# Patient Record
Sex: Female | Born: 1970 | Hispanic: Yes | Marital: Single | Smoking: Never smoker
Health system: Southern US, Community
[De-identification: ages and names within clinical notes are randomized; demographics above are authoritative.]

## PROBLEM LIST (undated history)

## (undated) DIAGNOSIS — M1990 Unspecified osteoarthritis, unspecified site: Secondary | ICD-10-CM

## (undated) HISTORY — DX: Unspecified osteoarthritis, unspecified site: M19.90

## (undated) HISTORY — PX: TENDON REPAIR: SHX5111

---

## 2007-12-18 ENCOUNTER — Emergency Department: Payer: Self-pay | Admitting: Emergency Medicine

## 2010-06-30 ENCOUNTER — Emergency Department: Payer: Self-pay | Admitting: Internal Medicine

## 2017-02-12 ENCOUNTER — Other Ambulatory Visit: Payer: Self-pay | Admitting: Family Medicine

## 2017-02-12 DIAGNOSIS — Z1231 Encounter for screening mammogram for malignant neoplasm of breast: Secondary | ICD-10-CM

## 2017-02-23 ENCOUNTER — Encounter: Payer: Self-pay | Admitting: Radiology

## 2017-02-23 ENCOUNTER — Ambulatory Visit
Admission: RE | Admit: 2017-02-23 | Discharge: 2017-02-23 | Disposition: A | Discharge status: Home / Self Care | Payer: BLUE CROSS/BLUE SHIELD | Source: Ambulatory Visit | Attending: Family Medicine | Admitting: Family Medicine

## 2017-02-23 DIAGNOSIS — Z1231 Encounter for screening mammogram for malignant neoplasm of breast: Secondary | ICD-10-CM | POA: Insufficient documentation

## 2018-06-27 ENCOUNTER — Emergency Department (HOSPITAL_COMMUNITY): Payer: No Typology Code available for payment source

## 2018-06-27 ENCOUNTER — Inpatient Hospital Stay (HOSPITAL_COMMUNITY)
Admission: EM | Admit: 2018-06-27 | Discharge: 2018-06-29 | DRG: 605 | Disposition: A | Discharge status: Home / Self Care | Payer: No Typology Code available for payment source | Attending: Surgery | Admitting: Surgery

## 2018-06-27 ENCOUNTER — Other Ambulatory Visit: Payer: Self-pay

## 2018-06-27 ENCOUNTER — Encounter (HOSPITAL_COMMUNITY): Payer: Self-pay | Admitting: Emergency Medicine

## 2018-06-27 DIAGNOSIS — S61215A Laceration without foreign body of left ring finger without damage to nail, initial encounter: Secondary | ICD-10-CM | POA: Diagnosis present

## 2018-06-27 DIAGNOSIS — S27892A Contusion of other specified intrathoracic organs, initial encounter: Secondary | ICD-10-CM | POA: Diagnosis present

## 2018-06-27 DIAGNOSIS — S272XXA Traumatic hemopneumothorax, initial encounter: Secondary | ICD-10-CM | POA: Diagnosis present

## 2018-06-27 DIAGNOSIS — T7611XA Adult physical abuse, suspected, initial encounter: Secondary | ICD-10-CM | POA: Diagnosis present

## 2018-06-27 DIAGNOSIS — Z23 Encounter for immunization: Secondary | ICD-10-CM

## 2018-06-27 DIAGNOSIS — S61213A Laceration without foreign body of left middle finger without damage to nail, initial encounter: Secondary | ICD-10-CM | POA: Diagnosis present

## 2018-06-27 DIAGNOSIS — S21112A Laceration without foreign body of left front wall of thorax without penetration into thoracic cavity, initial encounter: Principal | ICD-10-CM | POA: Diagnosis present

## 2018-06-27 DIAGNOSIS — S21119A Laceration without foreign body of unspecified front wall of thorax without penetration into thoracic cavity, initial encounter: Secondary | ICD-10-CM | POA: Diagnosis present

## 2018-06-27 LAB — TYPE AND SCREEN
ABO/RH(D): O NEG
ANTIBODY SCREEN: NEGATIVE
UNIT DIVISION: 0
Unit division: 0

## 2018-06-27 LAB — PREPARE FRESH FROZEN PLASMA
UNIT DIVISION: 0
Unit division: 0

## 2018-06-27 LAB — COMPREHENSIVE METABOLIC PANEL
ALBUMIN: 3.9 g/dL (ref 3.5–5.0)
ALK PHOS: 72 U/L (ref 38–126)
ALT: 31 U/L (ref 0–44)
AST: 29 U/L (ref 15–41)
Anion gap: 7 (ref 5–15)
BILIRUBIN TOTAL: 0.5 mg/dL (ref 0.3–1.2)
BUN: 17 mg/dL (ref 6–20)
CO2: 20 mmol/L — ABNORMAL LOW (ref 22–32)
CREATININE: 0.96 mg/dL (ref 0.44–1.00)
Calcium: 8.3 mg/dL — ABNORMAL LOW (ref 8.9–10.3)
Chloride: 107 mmol/L (ref 98–111)
GFR calc Af Amer: 40 mL/min — ABNORMAL LOW (ref >60)
GFR calc non Af Amer: 34 mL/min — ABNORMAL LOW (ref >60)
GLUCOSE: 155 mg/dL — AB (ref 70–99)
Potassium: 3.5 mmol/L (ref 3.5–5.1)
Sodium: 134 mmol/L — ABNORMAL LOW (ref 135–145)
TOTAL PROTEIN: 7.3 g/dL (ref 6.5–8.1)

## 2018-06-27 LAB — BPAM RBC
BLOOD PRODUCT EXPIRATION DATE: 201910202359
Blood Product Expiration Date: 201910202359
ISSUE DATE / TIME: 201910062021
ISSUE DATE / TIME: 201910062021
UNIT TYPE AND RH: 9500
Unit Type and Rh: 9500

## 2018-06-27 LAB — I-STAT CHEM 8, ED
BUN: 23 mg/dL — ABNORMAL HIGH (ref 6–20)
CHLORIDE: 109 mmol/L (ref 98–111)
Calcium, Ion: 1 mmol/L — ABNORMAL LOW (ref 1.15–1.40)
Creatinine, Ser: 0.9 mg/dL (ref 0.44–1.00)
Glucose, Bld: 155 mg/dL — ABNORMAL HIGH (ref 70–99)
HEMATOCRIT: 43 % (ref 36.0–46.0)
Hemoglobin: 14.6 g/dL (ref 12.0–15.0)
POTASSIUM: 4.3 mmol/L (ref 3.5–5.1)
SODIUM: 140 mmol/L (ref 135–145)
TCO2: 22 mmol/L (ref 22–32)

## 2018-06-27 LAB — URINALYSIS, ROUTINE W REFLEX MICROSCOPIC
BACTERIA UA: NONE SEEN
Bilirubin Urine: NEGATIVE
Glucose, UA: NEGATIVE mg/dL
Ketones, ur: NEGATIVE mg/dL
Leukocytes, UA: NEGATIVE
Nitrite: NEGATIVE
Protein, ur: NEGATIVE mg/dL
SPECIFIC GRAVITY, URINE: 1.038 — AB (ref 1.005–1.030)
pH: 6 (ref 5.0–8.0)

## 2018-06-27 LAB — BPAM FFP
BLOOD PRODUCT EXPIRATION DATE: 201910192359
BLOOD PRODUCT EXPIRATION DATE: 201910262359
ISSUE DATE / TIME: 201910062021
ISSUE DATE / TIME: 201910062021
UNIT TYPE AND RH: 600
Unit Type and Rh: 6200

## 2018-06-27 LAB — CDS SEROLOGY

## 2018-06-27 LAB — CBC
HEMATOCRIT: 43.8 % (ref 36.0–46.0)
Hemoglobin: 13.6 g/dL (ref 12.0–15.0)
MCH: 26.6 pg (ref 26.0–34.0)
MCHC: 31.1 g/dL (ref 30.0–36.0)
MCV: 85.7 fL (ref 78.0–100.0)
Platelets: 258 10*3/uL (ref 150–400)
RBC: 5.11 MIL/uL (ref 3.87–5.11)
RDW: 13.9 % (ref 11.5–15.5)
WBC: 8.1 10*3/uL (ref 4.0–10.5)

## 2018-06-27 LAB — I-STAT CG4 LACTIC ACID, ED: LACTIC ACID, VENOUS: 2.26 mmol/L — AB (ref 0.5–1.9)

## 2018-06-27 LAB — PROTIME-INR
INR: 0.99
Prothrombin Time: 13 seconds (ref 11.4–15.2)

## 2018-06-27 LAB — ETHANOL: Alcohol, Ethyl (B): <10 mg/dL (ref <10)

## 2018-06-27 MED ORDER — MORPHINE SULFATE (PF) 2 MG/ML IV SOLN: 1.0000 mg | INTRAVENOUS | Status: DC | PRN

## 2018-06-27 MED ORDER — LIDOCAINE-EPINEPHRINE (PF) 2 %-1:200000 IJ SOLN
10.0000 mL | Freq: Once | INTRAMUSCULAR | Status: AC
  Administered 2018-06-27: 10 mL via INTRADERMAL
  Filled 2018-06-27: qty 20

## 2018-06-27 MED ORDER — OXYCODONE HCL 5 MG PO TABS
5.0000 mg | ORAL_TABLET | ORAL | Status: DC | PRN
  Administered 2018-06-27: 5 mg via ORAL
  Filled 2018-06-27: qty 1

## 2018-06-27 MED ORDER — OXYCODONE HCL 5 MG PO TABS: 2.5000 mg | ORAL_TABLET | ORAL | Status: DC | PRN

## 2018-06-27 MED ORDER — CEFAZOLIN SODIUM-DEXTROSE 1-4 GM/50ML-% IV SOLN
1.0000 g | Freq: Once | INTRAVENOUS | Status: AC
  Administered 2018-06-27: 1 g via INTRAVENOUS
  Filled 2018-06-27: qty 50

## 2018-06-27 MED ORDER — SODIUM CHLORIDE 0.9 % IV SOLN
Freq: Once | INTRAVENOUS | Status: AC
  Administered 2018-06-27: 21:00:00 via INTRAVENOUS

## 2018-06-27 MED ORDER — IOHEXOL 300 MG/ML  SOLN
75.0000 mL | Freq: Once | INTRAMUSCULAR | Status: AC | PRN
  Administered 2018-06-27: 75 mL via INTRAVENOUS

## 2018-06-27 MED ORDER — MORPHINE SULFATE (PF) 2 MG/ML IV SOLN: 2.0000 mg | INTRAVENOUS | Status: DC | PRN

## 2018-06-27 MED ORDER — FENTANYL CITRATE (PF) 100 MCG/2ML IJ SOLN
50.0000 ug | Freq: Once | INTRAMUSCULAR | Status: AC
  Administered 2018-06-27: 50 ug via INTRAVENOUS
  Filled 2018-06-27: qty 2

## 2018-06-27 MED ORDER — MORPHINE SULFATE (PF) 4 MG/ML IV SOLN
4.0000 mg | INTRAVENOUS | Status: DC | PRN
  Administered 2018-06-27 – 2018-06-28 (×3): 4 mg via INTRAVENOUS
  Filled 2018-06-27 (×3): qty 1

## 2018-06-27 MED ORDER — ONDANSETRON 4 MG PO TBDP: 4.0000 mg | ORAL_TABLET | Freq: Four times a day (QID) | ORAL | Status: DC | PRN

## 2018-06-27 MED ORDER — ONDANSETRON HCL 4 MG/2ML IJ SOLN
INTRAMUSCULAR | Status: AC
  Administered 2018-06-27: 4 mg via INTRAVENOUS
  Filled 2018-06-27: qty 2

## 2018-06-27 MED ORDER — TETANUS-DIPHTH-ACELL PERTUSSIS 5-2.5-18.5 LF-MCG/0.5 IM SUSP
0.5000 mL | Freq: Once | INTRAMUSCULAR | Status: AC
  Administered 2018-06-27: 0.5 mL via INTRAMUSCULAR
  Filled 2018-06-27: qty 0.5

## 2018-06-27 MED ORDER — FENTANYL CITRATE (PF) 100 MCG/2ML IJ SOLN
INTRAMUSCULAR | Status: AC
  Administered 2018-06-27: 100 ug
  Filled 2018-06-27: qty 2

## 2018-06-27 MED ORDER — TETANUS-DIPHTH-ACELL PERTUSSIS 5-2.5-18.5 LF-MCG/0.5 IM SUSP
INTRAMUSCULAR | Status: AC
  Filled 2018-06-27: qty 0.5

## 2018-06-27 MED ORDER — OXYCODONE HCL 5 MG PO TABS: 10.0000 mg | ORAL_TABLET | ORAL | Status: DC | PRN

## 2018-06-27 MED ORDER — ONDANSETRON HCL 4 MG/2ML IJ SOLN
4.0000 mg | Freq: Four times a day (QID) | INTRAMUSCULAR | Status: DC | PRN
  Administered 2018-06-27 – 2018-06-28 (×3): 4 mg via INTRAVENOUS
  Filled 2018-06-27: qty 2
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
## 2018-06-27 NOTE — ED Notes (Signed)
Clothing went with [employer] officer [id number].
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
## 2018-06-27 NOTE — ED Notes (Signed)
Dr Jodi Mourning informed of lactic acid results 2.26

## 2018-06-28 ENCOUNTER — Other Ambulatory Visit: Payer: Self-pay

## 2018-06-28 ENCOUNTER — Inpatient Hospital Stay (HOSPITAL_COMMUNITY): Payer: No Typology Code available for payment source

## 2018-06-28 LAB — CBC
HCT: 42 % (ref 36.0–46.0)
HEMOGLOBIN: 13.5 g/dL (ref 12.0–15.0)
MCH: 27.1 pg (ref 26.0–34.0)
MCHC: 32.1 g/dL (ref 30.0–36.0)
MCV: 84.3 fL (ref 78.0–100.0)
Platelets: 221 10*3/uL (ref 150–400)
RBC: 4.98 MIL/uL (ref 3.87–5.11)
RDW: 14 % (ref 11.5–15.5)
WBC: 10.4 10*3/uL (ref 4.0–10.5)

## 2018-06-28 LAB — BASIC METABOLIC PANEL
Anion gap: 8 (ref 5–15)
BUN: 15 mg/dL (ref 6–20)
CALCIUM: 8.1 mg/dL — AB (ref 8.9–10.3)
CHLORIDE: 110 mmol/L (ref 98–111)
CO2: 19 mmol/L — ABNORMAL LOW (ref 22–32)
CREATININE: 0.73 mg/dL (ref 0.44–1.00)
GFR calc Af Amer: >60 mL/min (ref >60)
Glucose, Bld: 112 mg/dL — ABNORMAL HIGH (ref 70–99)
Potassium: 4.8 mmol/L (ref 3.5–5.1)
SODIUM: 137 mmol/L (ref 135–145)

## 2018-06-28 LAB — MRSA PCR SCREENING: MRSA by PCR: NEGATIVE

## 2018-06-28 LAB — ABO/RH: ABO/RH(D): O NEG

## 2018-06-28 LAB — BLOOD PRODUCT ORDER (VERBAL) VERIFICATION

## 2018-06-28 MED ORDER — CEFAZOLIN SODIUM-DEXTROSE 2-4 GM/100ML-% IV SOLN
2.0000 g | Freq: Three times a day (TID) | INTRAVENOUS | Status: DC
  Administered 2018-06-28 – 2018-06-29 (×4): 2 g via INTRAVENOUS
  Filled 2018-06-28 (×5): qty 100

## 2018-06-28 MED ORDER — MORPHINE SULFATE (PF) 4 MG/ML IV SOLN: 4.0000 mg | INTRAVENOUS | Status: DC | PRN

## 2018-06-28 MED ORDER — BACITRACIN-NEOMYCIN-POLYMYXIN 400-5-5000 EX OINT
TOPICAL_OINTMENT | Freq: Every day | CUTANEOUS | Status: DC
  Administered 2018-06-28: 1 via TOPICAL
  Administered 2018-06-29: 09:00:00 via TOPICAL
  Filled 2018-06-28 (×2): qty 1

## 2018-06-28 MED ORDER — IBUPROFEN 200 MG PO TABS
400.0000 mg | ORAL_TABLET | Freq: Four times a day (QID) | ORAL | Status: DC | PRN
  Filled 2018-06-28: qty 2

## 2018-06-28 MED ORDER — HYDROCODONE-ACETAMINOPHEN 7.5-325 MG PO TABS
1.0000 | ORAL_TABLET | Freq: Four times a day (QID) | ORAL | Status: DC | PRN
  Administered 2018-06-28 – 2018-06-29 (×3): 1 via ORAL
  Filled 2018-06-28 (×3): qty 1

## 2018-06-28 MED ORDER — ENOXAPARIN SODIUM 40 MG/0.4ML ~~LOC~~ SOLN
40.0000 mg | SUBCUTANEOUS | Status: DC
  Administered 2018-06-28: 40 mg via SUBCUTANEOUS
  Filled 2018-06-28: qty 0.4

## 2018-06-28 MED ORDER — POTASSIUM CHLORIDE IN NACL 20-0.9 MEQ/L-% IV SOLN
INTRAVENOUS | Status: DC
  Administered 2018-06-28: 03:00:00 via INTRAVENOUS
  Filled 2018-06-28: qty 1000
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
## 2018-06-28 NOTE — Progress Notes (Signed)
Report given to Herbert Seta, RN 4NP

## 2018-06-28 NOTE — Progress Notes (Addendum)
CRITICAL VALUE ALERT  Critical Value: chest xray results called from [facility] Radiology  Date & Time Notied:  06/28/18 at 0730  Provider Notified: Janee Morn, MD   Orders Received/Actions taken: No new orders at this time

## 2018-06-29 MED ORDER — HYDROCODONE-ACETAMINOPHEN 7.5-325 MG PO TABS: 1.0000 | ORAL_TABLET | Freq: Four times a day (QID) | ORAL | 0 refills | Status: AC | PRN

## 2018-06-29 NOTE — Progress Notes (Signed)
  Subjective: Less chest pain, no SOB  Objective: Vital signs in last 24 hours: Temp:  [98.1 F (36.7 C)-98.8 F (37.1 C)] 98.1 F (36.7 C) (10/08 0300) Pulse Rate:  [65-94] 76 (10/08 0751) Resp:  [10-20] 14 (10/08 0751) BP: (113-143)/(71-84) 122/72 (10/08 0751) SpO2:  [96 %-100 %] 98 % (10/08 0751) Last BM Date: (PTA)  Intake/Output from previous day: 10/07 0701 - 10/08 0700 In: 669.9 [P.O.:480; I.V.:189.9] Out: -  Intake/Output this shift: Total I/O In: 400.4 [I.V.:100.4; IV Piggyback:300] Out: -   General appearance: alert and cooperative Resp: clear to auscultation bilaterally Chest wall: SW CDI with staples, small hematoma, no cellulitis Cardio: regular rate and rhythm GI: soft, non-tender; bowel sounds normal; no masses,  no organomegaly Extremities: L fingers 103 SWs CDI with sutures  Lab Results: CBC  Recent Labs    06/27/18 2033 06/27/18 2045 06/28/18 0243  WBC 8.1  --  10.4  HGB 13.6 14.6 13.5  HCT 43.8 43.0 42.0  PLT 258  --  221   BMET Recent Labs    06/27/18 2033 06/27/18 2045 06/28/18 0243  NA 134* 140 137  K 3.5 4.3 4.8  CL 107 109 110  CO2 20*  --  19*  GLUCOSE 155* 155* 112*  BUN 17 23* 15  CREATININE 0.96 0.90 0.73  CALCIUM 8.3*  --  8.1*   PT/INR Recent Labs    06/27/18 2033  LABPROT 13.0  INR 0.99    Anti-infectives: Anti-infectives (From admission, onward)   Start     Dose/Rate Route Frequency Ordered Stop   06/28/18 0400  ceFAZolin (ANCEF) IVPB 2g/100 mL premix     2 g 200 mL/hr over 30 Minutes Intravenous Every 8 hours 06/28/18 0015     06/27/18 2045  ceFAZolin (ANCEF) IVPB 1 g/50 mL premix     1 g 100 mL/hr over 30 Minutes Intravenous  Once 06/27/18 2033 06/27/18 2112      Assessment/Plan: SW chest Mediastinal hematoma and pneumomediastinum - stable ID - completed Ancef FEN - Soft diet VTE - PAS, Lovenox if not D/C Dispo - CSW to speak with her with interpreter at 1100 then plan D/C home after. She lives  with her 24yo son. Wound care instructions discussed with her. Plan F/U in trauma clinic 10/15   LOS: 2 days    Violeta Gelinas, MD, MPH, FACS Trauma: [phone] General Surgery: [phone]  10/8/2019Patient ID: Anna Vega, female   DOB: [birth year], 47 y.o.   MRN: [id number]

## 2018-06-29 NOTE — Progress Notes (Signed)
Pt with d/c orders. Discharge summary reviewed with pt and son at bedside. All questions answered. Medication already sent to pharmacy to be picked up by pt. Ivs removed. Pt escorted to car via wheelchair.
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
## 2018-06-30 ENCOUNTER — Encounter: Payer: Self-pay | Admitting: Radiology

## 2019-12-16 ENCOUNTER — Ambulatory Visit: Payer: Self-pay | Attending: Internal Medicine

## 2020-01-06 ENCOUNTER — Ambulatory Visit: Payer: Self-pay

## 2021-04-17 ENCOUNTER — Encounter: Payer: Self-pay | Admitting: *Deleted

## 2021-04-17 ENCOUNTER — Ambulatory Visit: Payer: Self-pay | Attending: Oncology | Admitting: *Deleted

## 2021-04-17 ENCOUNTER — Encounter: Payer: Self-pay | Admitting: Student

## 2021-04-17 ENCOUNTER — Other Ambulatory Visit: Payer: Self-pay

## 2021-04-17 ENCOUNTER — Ambulatory Visit
Admission: RE | Admit: 2021-04-17 | Discharge: 2021-04-17 | Disposition: A | Discharge status: Home / Self Care | Payer: Self-pay | Source: Ambulatory Visit | Attending: Oncology | Admitting: Oncology

## 2021-04-17 VITALS — BP 141/86 | HR 91 | Temp 96.7°F | Ht 61.0 in | Wt 177.7 lb

## 2021-04-17 DIAGNOSIS — Z0000 Encounter for general adult medical examination without abnormal findings: Secondary | ICD-10-CM

## 2021-04-17 NOTE — Patient Instructions (Signed)
Gave patient hand-out, Women Staying Healthy, Active and Well from [facility], with education on breast health, pap smears, heart and colon health. 

## 2021-04-17 NOTE — Progress Notes (Signed)
  Subjective:     Patient ID: Anna Vega, female   DOB: Jul 28, 1971, 50 y.o.   MRN: [id number]  HPI  B[facility]CP Medical History Record - 04/17/21 1017       Breast History   Screening cycle New    Provider ([facility]) [facility]    Initial Mammogram 04/17/21    Last Mammogram Annual    Last Mammogram Date 02/23/17    Provider (Mammogram)  Delford Field    Recent Breast Symptoms None      Breast Cancer History   Breast Cancer History No personal or family history      Previous History of Breast Problems   Breast Surgery or Biopsy None    Breast Implants N/A    BSE Done Monthly      Gynecological/Obstetrical History   LMP --   2 years ago   Is there any chance that the client could be pregnant?  No    Age at menarche 50    Age at menopause 50    PAP smear history Annually    Date of last PAP  03/15/21    Provider (PAP) Bernestine Amass neg/neg    Age at first live birth 50    Breast fed children Yes (type length in comments)   1 year 3 months   DES Exposure No    Cervical, Uterine or Ovarian cancer No    Family history of Cervial, Uterine or Ovarian cancer Yes   sister uterine?   Hysterectomy No    Cervix removed No    Ovaries removed No    Laser/Cryosurgery No    Current method of birth control None    Current method of Estrogen/Hormone replacement None    Smoking history None    Comments No inusrance               Review of Systems     Objective:   Physical Exam Chest:  Breasts:    Right: No swelling, bleeding, inverted nipple, mass, nipple discharge, skin change, tenderness, axillary adenopathy or supraclavicular adenopathy.     Left: No swelling, bleeding, inverted nipple, mass, nipple discharge, skin change, tenderness, axillary adenopathy or supraclavicular adenopathy.  Lymphadenopathy:     Upper Body:     Right upper body: No supraclavicular or axillary adenopathy.     Left upper body: No supraclavicular or axillary adenopathy.       Assessment:      50 year old Hispanic female presents to [facility] for clinical breast exam and mammogram.  Anna Vega, the interpreter present during the interview and exam.  Patient's daughter is also present.  Clinical breast exam without dominant mass, skin changes, nipple discharge or lymphadenopathy.  Taught self breast awareness.  Last pap was on 03/15/21 and was negative / negative.  Next pap due in 2027.  Patient has been screened for eligibility.  She does not have any insurance, Medicare or Medicaid.  She also meets financial eligibility.   Risk Assessment     Risk Scores       04/17/2021   Last edited by: Scarlett Presto, RN   5-year risk: 0.5 %   Lifetime risk: 4.1 %               Plan:     Screening mammogram ordered.  Will follow up per BCCCP protocol.

## 2021-04-22 ENCOUNTER — Encounter: Payer: Self-pay | Admitting: *Deleted

## 2021-04-22 NOTE — Progress Notes (Signed)
Letter mailed from the [facility] to inform patient of her normal mammogram results.  Patient is to follow-up with annual screening in one year. 

## 2022-04-09 ENCOUNTER — Other Ambulatory Visit: Payer: Self-pay | Admitting: Oncology

## 2022-04-09 DIAGNOSIS — Z1231 Encounter for screening mammogram for malignant neoplasm of breast: Secondary | ICD-10-CM

## 2022-05-08 ENCOUNTER — Ambulatory Visit: Payer: Self-pay

## 2022-07-03 ENCOUNTER — Other Ambulatory Visit: Payer: Self-pay

## 2022-07-03 DIAGNOSIS — Z1231 Encounter for screening mammogram for malignant neoplasm of breast: Secondary | ICD-10-CM

## 2022-07-07 ENCOUNTER — Ambulatory Visit
Admission: RE | Admit: 2022-07-07 | Discharge: 2022-07-07 | Disposition: A | Discharge status: Home / Self Care | Payer: Self-pay | Source: Ambulatory Visit | Attending: Obstetrics and Gynecology | Admitting: Obstetrics and Gynecology

## 2022-07-07 ENCOUNTER — Ambulatory Visit: Payer: Self-pay | Attending: Hematology and Oncology | Admitting: Hematology and Oncology

## 2022-07-07 VITALS — BP 143/88 | Wt 181.8 lb

## 2022-07-07 DIAGNOSIS — Z1231 Encounter for screening mammogram for malignant neoplasm of breast: Secondary | ICD-10-CM | POA: Insufficient documentation

## 2022-07-07 NOTE — Patient Instructions (Signed)
Taught Anna Vega breast self awareness. Patient did not need a Pap smear today due to last Pap smear was in 2022 per patient and was negative. Her next Pap smear will be in 2027. Let her know [facility]CP will cover Pap smears every 5 years unless has a history of abnormal Pap smears. [facility]. Appointment scheduled for 07/07/22. Patient aware of appointment and will be there. Let patient know will follow up with her within the next couple weeks with results. She will return to clinic in one year for repeat mammogram. Anna Vega verbalized understanding.  Melodye Ped, NP 2:34 PM

## 2022-07-07 NOTE — Progress Notes (Addendum)
Anna Vega is a 51 y.o. female who presents to [facility] clinic today with no complaints.    Pap Smear: Pap not smear completed today. Last Pap smear was 03/15/2021 at [facility] clinic and was normal. Abnormal paps noted in 2005 and 2006 with colposcopy and benign findings. Negative Pap smears in August 2005, Feb and August 2006. Most recent Pap results are available in Epic.    Physical exam: Breasts Breasts symmetrical. No skin abnormalities bilateral breasts. No nipple retraction bilateral breasts. No nipple discharge bilateral breasts. No lymphadenopathy. No lumps palpated bilateral breasts.    MS DIGITAL SCREENING TOMO BILATERAL  Result Date: 04/18/2021 CLINICAL DATA:  Screening. EXAM: DIGITAL SCREENING BILATERAL MAMMOGRAM WITH TOMOSYNTHESIS AND CAD TECHNIQUE: Bilateral screening digital craniocaudal and mediolateral oblique mammograms were obtained. Bilateral screening digital breast tomosynthesis was performed. The images were evaluated with computer-aided detection. COMPARISON:  Previous exam(s). ACR Breast Density Category c: The breast tissue is heterogeneously dense, which may obscure small masses. FINDINGS: There are no findings suspicious for malignancy. IMPRESSION: No mammographic evidence of malignancy. A result letter of this screening mammogram will be mailed directly to the patient. RECOMMENDATION: Screening mammogram in one year. (Code:SM-B-01Y) BI-RADS CATEGORY  1: Negative. Electronically Signed   By: Everlean Alstrom M.D.   On: 04/18/2021 13:23      Pelvic/Bimanual Pap is not indicated today    Smoking History: Patient has never smoked and was not referred to quit line.    Patient Navigation: Patient education provided. Access to services provided for patient through [name] [phone] interpreter provided. No transportation provided   Colorectal Cancer Screening: Per patient has never had colonoscopy completed No complaints today. FIT  test given per [facility].   Breast and Cervical Cancer Risk Assessment: Patient does not have family history of breast cancer, known genetic mutations, or radiation treatment to the chest before age 80. Patient does not have history of cervical dysplasia, immunocompromised, or DES exposure in-utero.  Risk Assessment   No risk assessment data for the current encounter  Risk Scores       04/17/2021   Last edited by: Theodore Demark, RN   5-year risk: 0.5 %   Lifetime risk: 4.1 %            A: BCCCP exam without pap smear No complaints with benign exam.   P: Referred patient to the [facility] for a screening mammogram. Appointment scheduled 07/07/22.  Melodye Ped, NP 07/07/2022 2:14 PM

## 2022-08-16 IMAGING — MG MM DIGITAL SCREENING BILAT W/ TOMO AND CAD
8 series · 8 of 24 positions shown · non-contrast
Comparison: Previous exam(s).

CLINICAL DATA: Screening.

EXAM:
DIGITAL SCREENING BILATERAL MAMMOGRAM WITH TOMOSYNTHESIS AND CAD
TECHNIQUE: Bilateral screening digital craniocaudal and mediolateral oblique
mammograms were obtained. Bilateral screening digital breast
tomosynthesis was performed. The images were evaluated with
computer-aided detection.

[R CC synth-2D]
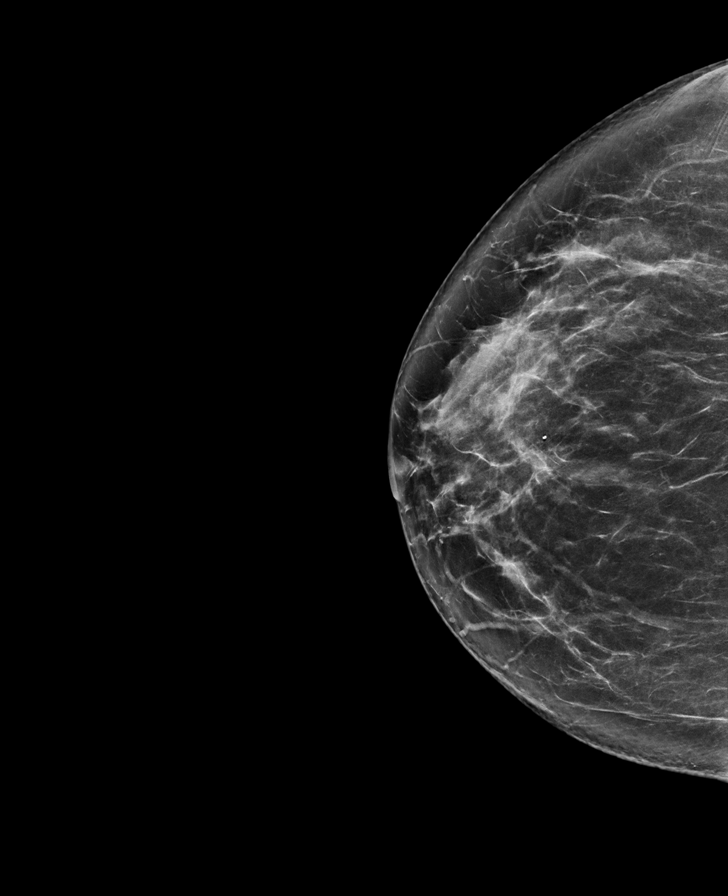

[L CC synth-2D]
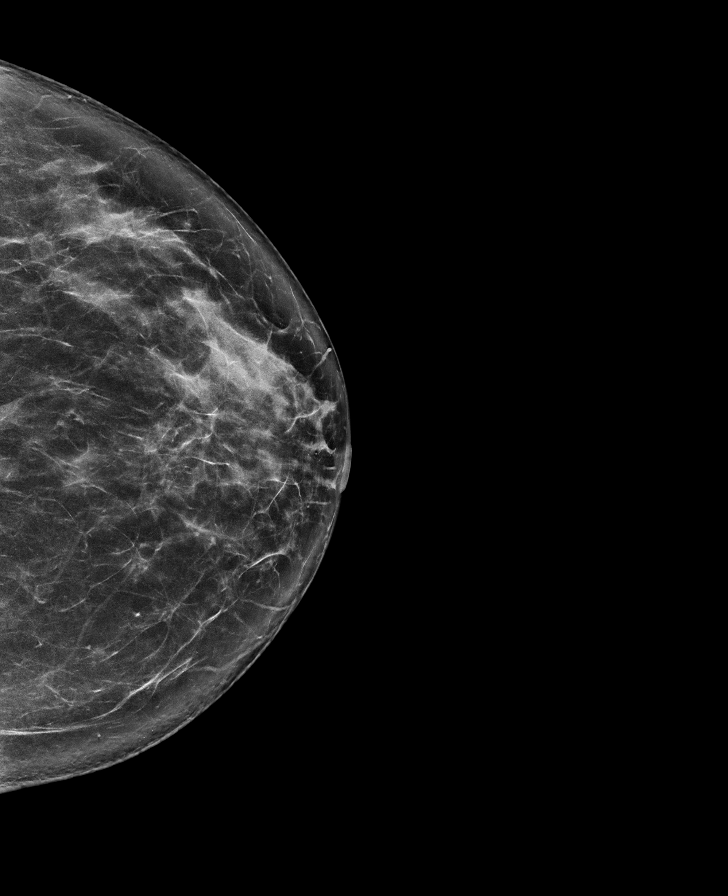

[R MLO synth-2D]
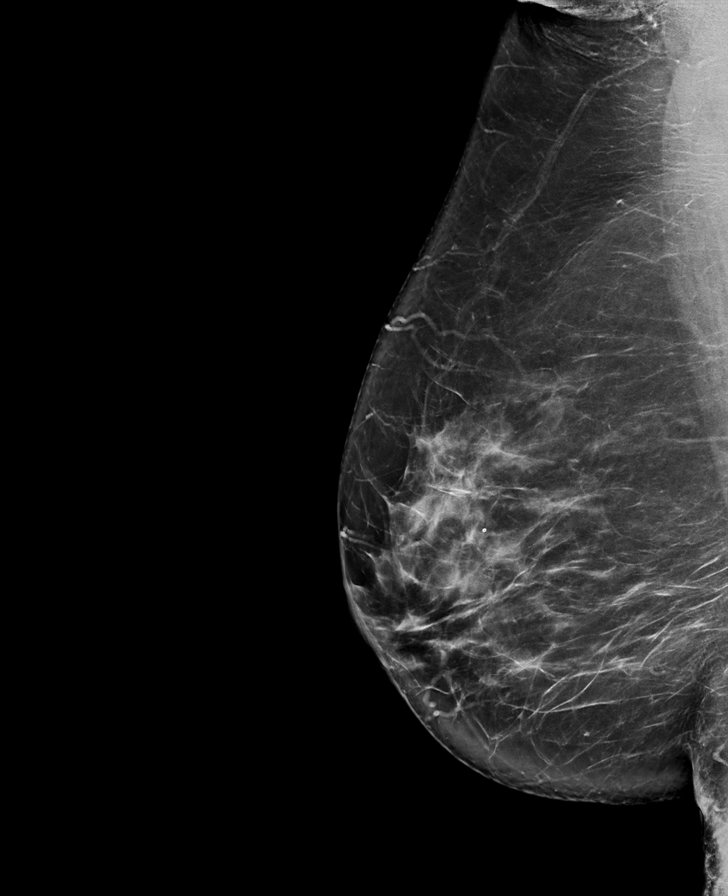

[L MLO synth-2D]
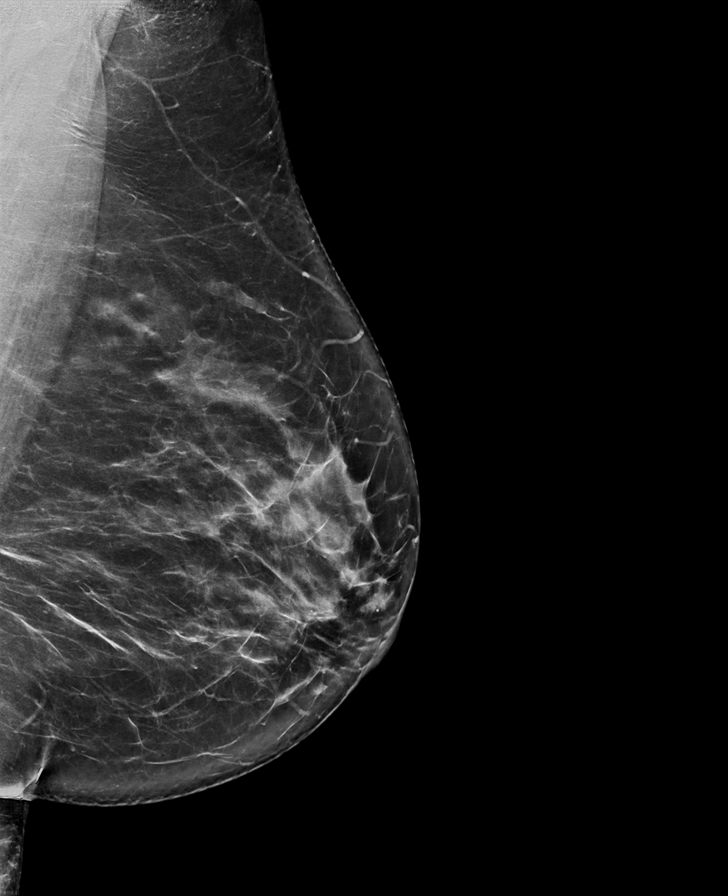

[L MLO tomo · tomo slice 45/90.0]
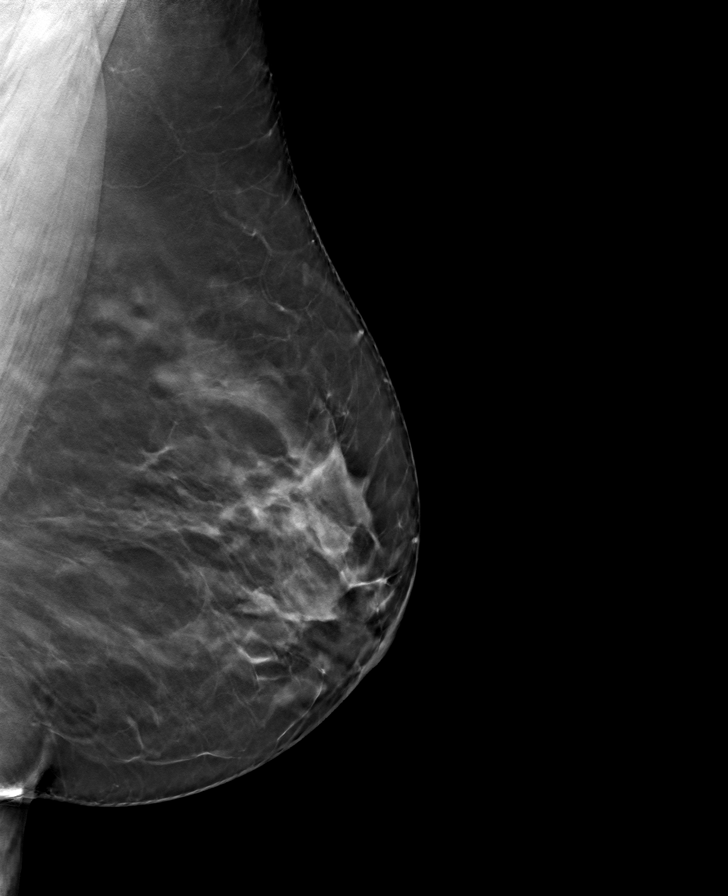

[R CC tomo · tomo slice 43/84.0]
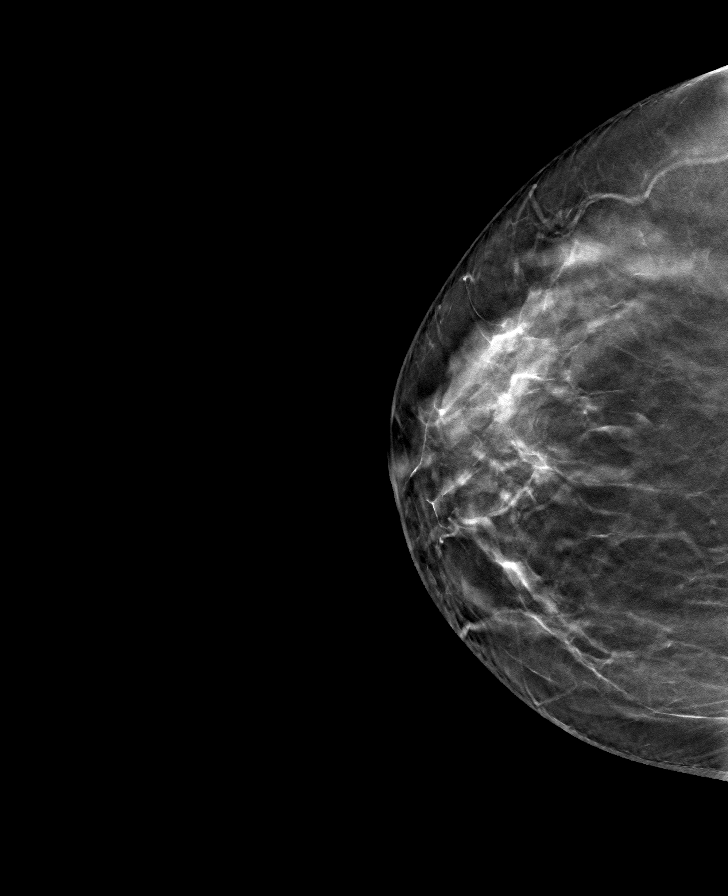

[L CC tomo · tomo slice 42/83.0]
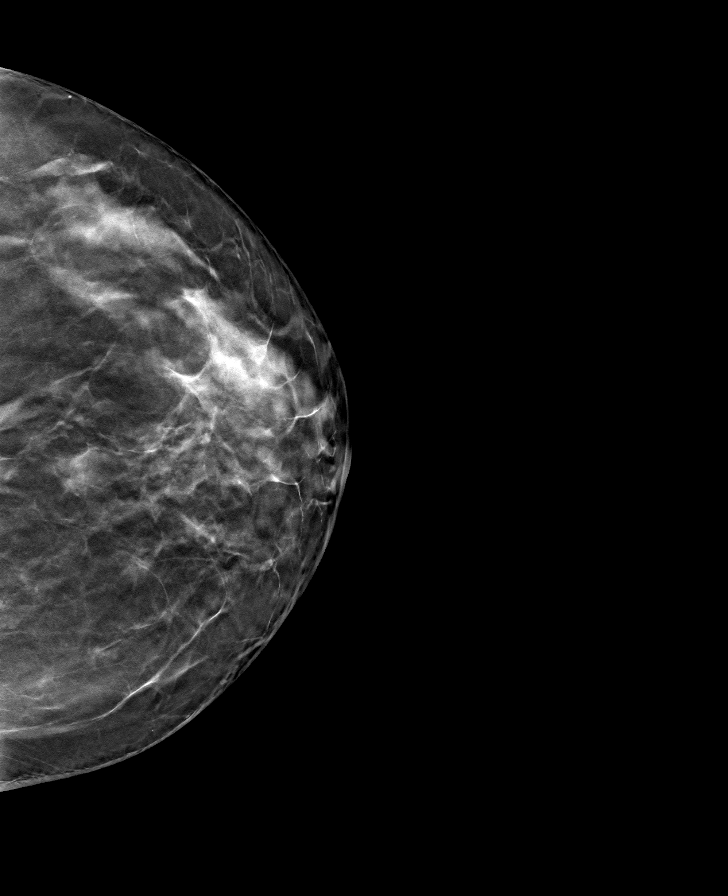

[R MLO tomo · tomo slice 47/93.0]
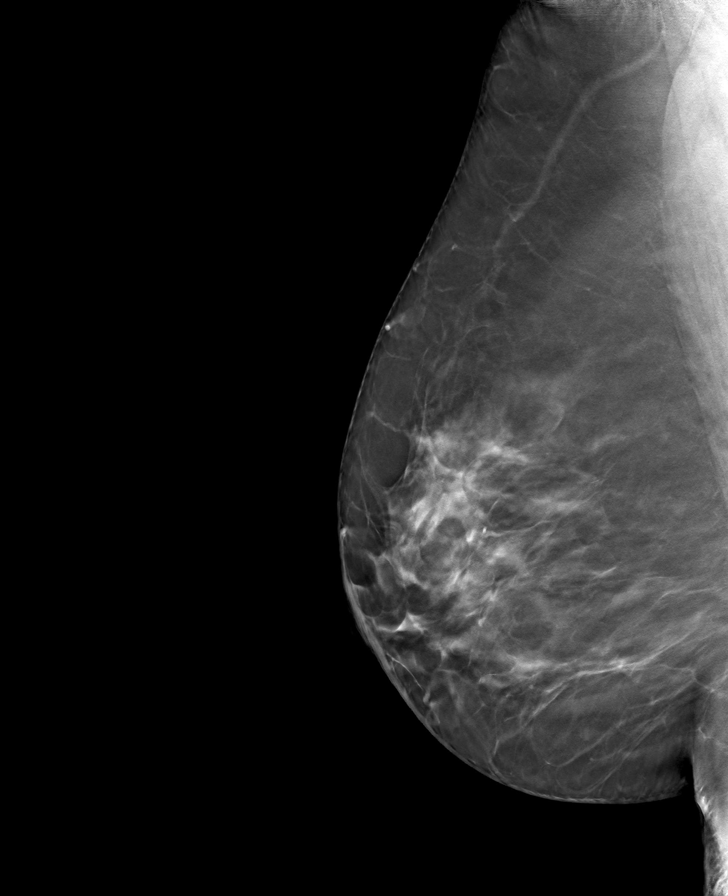

[8 of 24 positions shown; findings below may reference images not displayed]

ACR Breast Density Category c: The breast tissue is heterogeneously
dense, which may obscure small masses.
FINDINGS: There are no findings suspicious for malignancy.
IMPRESSION: No mammographic evidence of malignancy. A result letter of this
screening mammogram will be mailed directly to the patient.

RECOMMENDATION:
Screening mammogram in one year. (Code:Q3-W-BC3)

BI-RADS CATEGORY  1: Negative.

## 2023-08-18 ENCOUNTER — Other Ambulatory Visit: Payer: Self-pay

## 2023-08-18 DIAGNOSIS — Z1231 Encounter for screening mammogram for malignant neoplasm of breast: Secondary | ICD-10-CM

## 2023-08-24 ENCOUNTER — Ambulatory Visit
Admission: RE | Admit: 2023-08-24 | Discharge: 2023-08-24 | Disposition: A | Discharge status: Home / Self Care | Payer: Self-pay | Source: Ambulatory Visit | Attending: Obstetrics and Gynecology | Admitting: Obstetrics and Gynecology

## 2023-08-24 ENCOUNTER — Ambulatory Visit: Payer: Self-pay | Attending: Hematology and Oncology | Admitting: Hematology and Oncology

## 2023-08-24 VITALS — BP 138/88 | Wt 172.2 lb

## 2023-08-24 DIAGNOSIS — Z1231 Encounter for screening mammogram for malignant neoplasm of breast: Secondary | ICD-10-CM

## 2023-08-24 NOTE — Progress Notes (Signed)
[name] is a 52 y.o. female who presents to [facility] clinic today with no complaints.    Pap Smear: Pap not smear completed today. Last Pap smear was 03/15/2021 at [facility] clinic and was normal. Per patient has no history of an abnormal Pap smear. Last Pap smear result is available in Epic.   Physical exam: Breasts Breasts symmetrical. No skin abnormalities bilateral breasts. No nipple retraction bilateral breasts. No nipple discharge bilateral breasts. No lymphadenopathy. No lumps palpated bilateral breasts.     MS DIGITAL SCREENING TOMO BILATERAL  Result Date: 07/09/2022 CLINICAL DATA:  Screening. EXAM: DIGITAL SCREENING BILATERAL MAMMOGRAM WITH TOMOSYNTHESIS AND CAD TECHNIQUE: Bilateral screening digital craniocaudal and mediolateral oblique mammograms were obtained. Bilateral screening digital breast tomosynthesis was performed. The images were evaluated with computer-aided detection. COMPARISON:  Previous exam(s). ACR Breast Density Category c: The breast tissue is heterogeneously dense, which may obscure small masses. FINDINGS: There are no findings suspicious for malignancy. IMPRESSION: No mammographic evidence of malignancy. A result letter of this screening mammogram will be mailed directly to the patient. RECOMMENDATION: Screening mammogram in one year. (Code:[id number]-01Y) BI-RADS CATEGORY  1: Negative. Electronically Signed   By: Bary Richard M.D.   On: 07/09/2022 09:19   MS DIGITAL SCREENING TOMO BILATERAL  Result Date: 04/18/2021 CLINICAL DATA:  Screening. EXAM: DIGITAL SCREENING BILATERAL MAMMOGRAM WITH TOMOSYNTHESIS AND CAD TECHNIQUE: Bilateral screening digital craniocaudal and mediolateral oblique mammograms were obtained. Bilateral screening digital breast tomosynthesis was performed. The images were evaluated with computer-aided detection. COMPARISON:  Previous exam(s). ACR Breast Density Category c: The breast tissue is heterogeneously dense, which may obscure small  masses. FINDINGS: There are no findings suspicious for malignancy. IMPRESSION: No mammographic evidence of malignancy. A result letter of this screening mammogram will be mailed directly to the patient. RECOMMENDATION: Screening mammogram in one year. (Code:[id number]-01Y) BI-RADS CATEGORY  1: Negative. Electronically Signed   By: Edwin Cap M.D.   On: 04/18/2021 13:23     Pelvic/Bimanual Pap is not indicated today    Smoking History: Patient has never smoked and was not referred to quit line.    Patient Navigation: Patient education provided. Access to services provided for patient through BCCCP program. Delos Haring interpreter provided. No transportation provided   Colorectal Cancer Screening: Per patient has never had colonoscopy completed No complaints today. Cologuard 2023 negative.    Breast and Cervical Cancer Risk Assessment: Patient does not have family history of breast cancer, known genetic mutations, or radiation treatment to the chest before age 71. Patient does not have history of cervical dysplasia, immunocompromised, or DES exposure in-utero.  Risk Scores as of Encounter on 08/24/2023     Anna Vega           5-year 0.72%   Lifetime 5.67%   This patient is Hisp[place]/Latina but has no documented birth country, so the [place] model used data from [place] patients to calculate their risk score. Document a birth country in the Demographics activity for a more accurate score.         Last calculated by Narda Rutherford, LPN on 16/09/958 at  2:55 PM          A: B[facility]CP exam without pap smear No complaints with benign exam.   P: Referred patient to the [facility] for a screening mammogram. Appointment scheduled 08/24/2023.  Pascal Lux, NP 08/24/2023 2:44 PM

## 2023-08-24 NOTE — Patient Instructions (Signed)
Taught Greg Cutter about self breast awareness and gave educational materials to take home. Patient did not need a Pap smear today due to last Pap smear was in 03/15/2021 per patient. Let her know [facility] will cover Pap smears every 5 years unless has a history of abnormal Pap smears. Referred patient to the [facility] for screening mammogram. Appointment scheduled for 08/24/2023. Patient aware of appointment and will be there. Let patient know will follow up with her within the next couple weeks with results. Greg Cutter verbalized understanding.  Pascal Lux, NP 2:47 PM

## 2024-08-10 ENCOUNTER — Telehealth: Payer: Self-pay

## 2024-08-31 ENCOUNTER — Other Ambulatory Visit: Payer: Self-pay | Admitting: *Deleted

## 2024-08-31 DIAGNOSIS — Z1231 Encounter for screening mammogram for malignant neoplasm of breast: Secondary | ICD-10-CM

## 2024-09-30 ENCOUNTER — Ambulatory Visit
Admission: RE | Admit: 2024-09-30 | Discharge: 2024-09-30 | Disposition: A | Payer: Self-pay | Source: Ambulatory Visit | Attending: *Deleted | Admitting: *Deleted

## 2024-09-30 DIAGNOSIS — Z1231 Encounter for screening mammogram for malignant neoplasm of breast: Secondary | ICD-10-CM | POA: Insufficient documentation
